# Patient Record
Sex: Female | Born: 2000 | Race: White | Hispanic: No | Marital: Single | Smoking: Never smoker
Health system: Southern US, Community
[De-identification: ages and names within clinical notes are randomized; demographics above are authoritative.]

## PROBLEM LIST (undated history)

## (undated) DIAGNOSIS — J020 Streptococcal pharyngitis: Secondary | ICD-10-CM

---
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
## 2022-01-25 ENCOUNTER — Ambulatory Visit (HOSPITAL_COMMUNITY)
Admission: EM | Admit: 2022-01-25 | Discharge: 2022-01-25 | Disposition: A | Discharge status: Home / Self Care | Payer: Commercial Managed Care - PPO | Attending: Family Medicine | Admitting: Family Medicine

## 2022-01-25 ENCOUNTER — Encounter (HOSPITAL_COMMUNITY): Payer: Self-pay

## 2022-01-25 DIAGNOSIS — R112 Nausea with vomiting, unspecified: Secondary | ICD-10-CM

## 2022-01-25 DIAGNOSIS — E860 Dehydration: Secondary | ICD-10-CM | POA: Diagnosis not present

## 2022-01-25 LAB — POCT URINALYSIS DIPSTICK, ED / UC
Glucose, UA: 100 mg/dL — AB
Hgb urine dipstick: NEGATIVE
Ketones, ur: >=160 mg/dL — AB
Nitrite: NEGATIVE
Protein, ur: 100 mg/dL — AB
Specific Gravity, Urine: >=1.03 (ref 1.005–1.030)
Urobilinogen, UA: 0.2 mg/dL (ref 0.0–1.0)
pH: 5.5 (ref 5.0–8.0)

## 2022-01-25 LAB — POC URINE PREG, ED: Preg Test, Ur: NEGATIVE

## 2022-01-25 MED ORDER — ONDANSETRON 4 MG PO TBDP: 4.0000 mg | ORAL_TABLET | Freq: Three times a day (TID) | ORAL | 0 refills | Status: AC | PRN

## 2022-01-25 MED ORDER — ONDANSETRON 4 MG PO TBDP
ORAL_TABLET | ORAL | Status: AC
  Filled 2022-01-25: qty 1

## 2022-01-25 MED ORDER — ONDANSETRON 4 MG PO TBDP
4.0000 mg | ORAL_TABLET | Freq: Once | ORAL | Status: AC
  Administered 2022-01-25: 4 mg via ORAL

## 2022-01-25 NOTE — Discharge Instructions (Signed)
Please do your best to ensure adequate fluid intake in order to avoid dehydration. If you find that you are unable to tolerate drinking fluids regularly please proceed to the Emergency Department for evaluation. ° ° °

## 2022-01-25 NOTE — ED Provider Notes (Addendum)
[facility]   [id number] 01/25/22 Arrival Time: 1234  ASSESSMENT & PLAN:  1. Nausea and vomiting, unspecified vomiting type   2. Dehydration    Benign abdomen. Declines IVF. Is tolerating sips of water here.  Meds ordered this encounter  Medications   ondansetron (ZOFRAN-ODT) disintegrating tablet 4 mg   ondansetron (ZOFRAN-ODT) 4 MG disintegrating tablet    Sig: Take 1 tablet (4 mg total) by mouth every 8 (eight) hours as needed for nausea or vomiting.    Dispense:  15 tablet    Refill:  0   Has stopped Flagyl.  Discussed typical duration of symptoms for suspected viral GI illness. Will do her best to ensure adequate fluid intake in order to avoid dehydration. Will proceed to the Emergency Department for evaluation if unable to tolerate PO fluids regularly.  Otherwise she will f/u with her PCP or here if not showing improvement over the next 48-72 hours.  Reviewed expectations re: course of current medical issues. Questions answered. Outlined signs and symptoms indicating need for more acute intervention. Patient verbalized understanding. After Visit Summary given.   SUBJECTIVE: History from: patient.  Ashley Newman is a 21 y.o. female who presents with complaint of non-bilious, non-bloody nausea and vomiting; abrupt onset; past 24 hours; last emesis here. Without diarrhea. Started taking Flagyl 48-72 hours ago for BV; questions relation. No alcohol use. Decreased PO intake. Body aches. Is ambulatory. Mild HA. Afebrile.  Patient's last menstrual period was 01/18/2022 (approximate).  History reviewed. No pertinent surgical history.  OBJECTIVE:  Vitals:   01/25/22 1457  BP: 122/80  Pulse: 76  Resp: 16  Temp: 98.2 F (36.8 C)  TempSrc: Oral  SpO2: 98%    General appearance: alert; no distress Oropharynx: dry Lungs: clear to auscultation bilaterally; unlabored Heart: regular Abdomen: soft; non-distended; no significant abdominal tenderness;  reports "cramping" feeling; bowel sounds present; no masses or organomegaly; no guarding or rebound tenderness Back: no CVA tenderness Extremities: no edema; symmetrical with no gross deformities Skin: warm; dry Neurologic: normal gait Psychological: alert and cooperative; normal mood and affect  Labs: Results for orders placed or performed during the hospital encounter of 01/25/22  POC Urinalysis dipstick  Result Value Ref Range   Glucose, UA 100 (A) NEGATIVE mg/dL   Bilirubin Urine MODERATE (A) NEGATIVE   Ketones, ur >=160 (A) NEGATIVE mg/dL   Specific Gravity, Urine >=1.030 1.005 - 1.030   Hgb urine dipstick NEGATIVE NEGATIVE   pH 5.5 5.0 - 8.0   Protein, ur 100 (A) NEGATIVE mg/dL   Urobilinogen, UA 0.2 0.0 - 1.0 mg/dL   Nitrite NEGATIVE NEGATIVE   Leukocytes,Ua TRACE (A) NEGATIVE  POC urine pregnancy  Result Value Ref Range   Preg Test, Ur NEGATIVE NEGATIVE   Labs Reviewed  POCT URINALYSIS DIPSTICK, ED / UC - Abnormal; Notable for the following components:      Result Value   Glucose, UA 100 (*)    Bilirubin Urine MODERATE (*)    Ketones, ur >=160 (*)    Protein, ur 100 (*)    Leukocytes,Ua TRACE (*)    All other components within normal limits  POC URINE PREG, ED    No Known Allergies                                             Past Medical History:  Diagnosis Date  Strep throat    Social History   Socioeconomic History   Marital status: Single    Spouse name: Not on file   Number of children: Not on file   Years of education: Not on file   Highest education level: Not on file  Occupational History   Not on file  Tobacco Use   Smoking status: Never   Smokeless tobacco: Never  Substance and Sexual Activity   Alcohol use: No   Drug use: No   Sexual activity: Not on file  Other Topics Concern   Not on file  Social History Narrative   Not on file   Social Determinants of Health   Financial Resource Strain: Not on file  Food Insecurity: Not on file   Transportation Needs: Not on file  Physical Activity: Not on file  Stress: Not on file  Social Connections: Not on file  Intimate Partner Violence: Not on file   History reviewed. No pertinent family history.    Mardella Layman, MD 01/25/22 9574    Mardella Layman, MD 01/25/22 [phone]

## 2022-01-25 NOTE — ED Triage Notes (Signed)
Pt states N/V since last pm.  States she thinks it is from the Flagyl that she is taking. Pt also states her urine was dark this morning.

## 2022-09-01 ENCOUNTER — Other Ambulatory Visit: Payer: Self-pay

## 2022-09-01 ENCOUNTER — Emergency Department (HOSPITAL_COMMUNITY)
Admission: EM | Admit: 2022-09-01 | Discharge: 2022-09-01 | Disposition: A | Discharge status: Home / Self Care | Payer: Commercial Managed Care - PPO | Attending: Student | Admitting: Student

## 2022-09-01 DIAGNOSIS — Y92410 Unspecified street and highway as the place of occurrence of the external cause: Secondary | ICD-10-CM | POA: Insufficient documentation

## 2022-09-01 DIAGNOSIS — S00212A Abrasion of left eyelid and periocular area, initial encounter: Secondary | ICD-10-CM | POA: Diagnosis not present

## 2022-09-01 DIAGNOSIS — S0592XA Unspecified injury of left eye and orbit, initial encounter: Secondary | ICD-10-CM | POA: Diagnosis present
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
## 2022-09-01 NOTE — ED Notes (Signed)
Dc instructions reviewed with pt no questions or concerns at this time. Pt to follow up with pcp with any concerns
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
## 2023-04-30 DIAGNOSIS — J302 Other seasonal allergic rhinitis: Secondary | ICD-10-CM | POA: Diagnosis not present

## 2023-04-30 DIAGNOSIS — J029 Acute pharyngitis, unspecified: Secondary | ICD-10-CM | POA: Diagnosis not present

## 2023-08-06 DIAGNOSIS — Z23 Encounter for immunization: Secondary | ICD-10-CM | POA: Diagnosis not present
